# Patient Record
Sex: Female | Born: 2000 | Race: Black or African American | Hispanic: No | Smoking: Never smoker
Health system: Southern US, Community
[De-identification: ages and names within clinical notes are randomized; demographics above are authoritative.]

## PROBLEM LIST (undated history)

## (undated) DIAGNOSIS — L309 Dermatitis, unspecified: Secondary | ICD-10-CM

---
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
## 2021-07-08 HISTORY — PX: LIPOMA EXCISION: SHX5283

## 2022-05-20 ENCOUNTER — Other Ambulatory Visit (HOSPITAL_COMMUNITY)
Admission: RE | Admit: 2022-05-20 | Discharge: 2022-05-20 | Disposition: A | Discharge status: Home / Self Care | Payer: Medicaid Other | Source: Ambulatory Visit | Attending: Advanced Practice Midwife | Admitting: Advanced Practice Midwife

## 2022-05-20 ENCOUNTER — Ambulatory Visit (INDEPENDENT_AMBULATORY_CARE_PROVIDER_SITE_OTHER): Payer: Medicaid Other | Admitting: Obstetrics and Gynecology

## 2022-05-20 ENCOUNTER — Encounter: Payer: Self-pay | Admitting: Advanced Practice Midwife

## 2022-05-20 VITALS — BP 122/61 | HR 65 | Ht 69.0 in | Wt 203.4 lb

## 2022-05-20 DIAGNOSIS — Z309 Encounter for contraceptive management, unspecified: Secondary | ICD-10-CM

## 2022-05-20 DIAGNOSIS — Z3009 Encounter for other general counseling and advice on contraception: Secondary | ICD-10-CM | POA: Diagnosis not present

## 2022-05-20 DIAGNOSIS — Z30430 Encounter for insertion of intrauterine contraceptive device: Secondary | ICD-10-CM | POA: Diagnosis not present

## 2022-05-20 DIAGNOSIS — Z113 Encounter for screening for infections with a predominantly sexual mode of transmission: Secondary | ICD-10-CM | POA: Insufficient documentation

## 2022-05-20 LAB — POCT URINE PREGNANCY: Preg Test, Ur: NEGATIVE

## 2022-05-20 MED ORDER — LEVONORGESTREL 20 MCG/DAY IU IUD
1.0000 | INTRAUTERINE_SYSTEM | Freq: Once | INTRAUTERINE | Status: AC
  Administered 2022-05-20: 1 via INTRAUTERINE

## 2022-05-20 NOTE — Progress Notes (Signed)
20 yo P0 with LMP 04/18/22 with BMI 30 here for contraception counseling. Patient has been using COC for contraception and admits to being forgetful at time. She is interested in levonorgestrel- releasing IUD. Patient is without any complaints. She reports a monthly 5-7 day period which is on the heavier side. She is sexually active without complaints  Past Medical History:  Diagnosis Date   Eczema    Past Surgical History:  Procedure Laterality Date   LIPOMA EXCISION Right 2023   right buttock   History reviewed. No pertinent family history. Social History   Tobacco Use   Smoking status: Never   Smokeless tobacco: Never  Vaping Use   Vaping Use: Never used  Substance Use Topics   Alcohol use: Yes    Comment: social   Drug use: Yes    Types: Marijuana    Comment: edibles   ROS See pertinent in HPI. All other systems reviewed and non contributory Blood pressure 122/61, pulse 65, height 5\' 9"  (1.753 m), weight 203 lb 6.4 oz (92.3 kg), last menstrual period 04/18/2022. GENERAL: Well-developed, well-nourished female in no acute distress.  ABDOMEN: Soft, nontender, nondistended. No organomegaly. PELVIC: Normal external female genitalia. Vagina is pink and rugated.  Normal discharge. Normal appearing cervix. Uterus is normal in size. No adnexal mass or tenderness. Chaperone present during the pelvic exam EXTREMITIES: No cyanosis, clubbing, or edema, 2+ distal pulses.  A/P 21 yo P0 here for IUD insertion - GC/Cl cultures collected - IUD Procedure Note Patient identified, informed consent performed, signed copy in chart, time out was performed.  Urine pregnancy test negative.  Speculum placed in the vagina.  Cervix visualized.  Cleaned with Betadine x 3.  Grasped anteriorly with a single tooth tenaculum.  Uterus sounded to 8 cm.  Mirena IUD placed per manufacturer's recommendations.  Strings trimmed to 3 cm. Tenaculum was removed, good hemostasis noted.  Patient tolerated procedure  well.   Patient given post procedure instructions and Mirena care card with expiration date.  Patient is asked to check IUD strings periodically and follow up in 4-6 weeks for IUD check.

## 2022-05-20 NOTE — Progress Notes (Signed)
Patient presents as a new gyn. Patient wants to discuss switching to an IUD. States that she is not consistent with taking ocp. Denies having any vaginal discharge, odor, or irritation.

## 2022-05-20 NOTE — Addendum Note (Signed)
Addended by: Marya Landry D on: 05/20/2022 03:29 PM   Modules accepted: Orders

## 2022-05-21 LAB — URINE CYTOLOGY ANCILLARY ONLY
Chlamydia: NEGATIVE
Comment: NEGATIVE
Comment: NEGATIVE
Comment: NORMAL
Neisseria Gonorrhea: NEGATIVE
Trichomonas: NEGATIVE

## 2022-05-26 ENCOUNTER — Encounter: Payer: Self-pay | Admitting: Obstetrics and Gynecology

## 2022-06-17 ENCOUNTER — Encounter: Payer: Self-pay | Admitting: Obstetrics and Gynecology

## 2022-06-17 ENCOUNTER — Ambulatory Visit (INDEPENDENT_AMBULATORY_CARE_PROVIDER_SITE_OTHER): Payer: Medicaid Other | Admitting: Obstetrics and Gynecology

## 2022-06-17 VITALS — BP 124/79 | HR 60 | Wt 204.0 lb

## 2022-06-17 DIAGNOSIS — Z01812 Encounter for preprocedural laboratory examination: Secondary | ICD-10-CM | POA: Diagnosis not present

## 2022-06-17 DIAGNOSIS — Z30430 Encounter for insertion of intrauterine contraceptive device: Secondary | ICD-10-CM

## 2022-06-17 LAB — POCT URINE PREGNANCY: Preg Test, Ur: NEGATIVE

## 2022-06-17 MED ORDER — LEVONORGESTREL 20 MCG/DAY IU IUD
1.0000 | INTRAUTERINE_SYSTEM | Freq: Once | INTRAUTERINE | Status: AC
  Administered 2022-06-17: 1 via INTRAUTERINE

## 2022-06-17 NOTE — Progress Notes (Signed)
21 yo P0 here for IUD insertion. Patient had IUD inserted on 05/20/22 and IUD came out with tampon removal on 05/24/22. Patient is without any other complaints. She desires another Mirena IUD inserted  Past Medical History:  Diagnosis Date   Eczema    Past Surgical History:  Procedure Laterality Date   LIPOMA EXCISION Right 2023   right buttock   No family history on file. Social History   Tobacco Use   Smoking status: Never   Smokeless tobacco: Never  Vaping Use   Vaping Use: Never used  Substance Use Topics   Alcohol use: Yes    Comment: social   Drug use: Yes    Types: Marijuana    Comment: edibles   ROS See pertinent in HPI. All other systems reviewed and non contributory Blood pressure 124/79, pulse 60, weight 204 lb (92.5 kg), last menstrual period 05/24/2022. GENERAL: Well-developed, well-nourished female in no acute distress.  PELVIC: Normal external female genitalia. Vagina is pink and rugated.  Normal discharge. Normal appearing cervix. Uterus is normal in size. No adnexal mass or tenderness. Chaperone present during the pelvic exam EXTREMITIES: No cyanosis, clubbing, or edema, 2+ distal pulses.  A/P 21 yo here for IUD insertion IUD Procedure Note Patient identified, informed consent performed, signed copy in chart, time out was performed.  Urine pregnancy test negative.  Speculum placed in the vagina.  Cervix visualized.  Cleaned with Betadine x 2.  Grasped anteriorly with a single tooth tenaculum.  Uterus sounded to 8 cm.  Mirena IUD placed per manufacturer's recommendations.  Strings trimmed to 3 cm and tucked into the anterior fonix. Tenaculum was removed, good hemostasis noted.  Patient tolerated procedure well.   Patient given post procedure instructions and Mirena care card with expiration date.  Patient is asked to check IUD strings periodically and follow up in 4-6 weeks for IUD check.

## 2022-06-17 NOTE — Progress Notes (Signed)
Pt is in the office for [facility] follow up after IUD insertion on 05/20/22. Pt reports that IUD came out on her tampon on 05/24/22. Pt desires another IUD to be inserted.  LMP 05/24/22

## 2022-07-15 ENCOUNTER — Encounter: Payer: Self-pay | Admitting: Obstetrics and Gynecology

## 2022-07-15 ENCOUNTER — Other Ambulatory Visit (HOSPITAL_COMMUNITY)
Admission: RE | Admit: 2022-07-15 | Discharge: 2022-07-15 | Disposition: A | Discharge status: Home / Self Care | Payer: Medicaid Other | Source: Ambulatory Visit | Attending: Obstetrics and Gynecology | Admitting: Obstetrics and Gynecology

## 2022-07-15 ENCOUNTER — Ambulatory Visit (INDEPENDENT_AMBULATORY_CARE_PROVIDER_SITE_OTHER): Payer: Medicaid Other | Admitting: Obstetrics and Gynecology

## 2022-07-15 VITALS — BP 112/70 | HR 65 | Ht 69.0 in | Wt 209.0 lb

## 2022-07-15 DIAGNOSIS — Z124 Encounter for screening for malignant neoplasm of cervix: Secondary | ICD-10-CM

## 2022-07-15 DIAGNOSIS — Z30431 Encounter for routine checking of intrauterine contraceptive device: Secondary | ICD-10-CM | POA: Diagnosis not present

## 2022-07-15 NOTE — Progress Notes (Signed)
22 yo P0 presenting for IUD check. Patient had an IUD inserted on 06/17/22. She reports some occasional pelvic cramps and light vaginal bleeding. She has been sexually active without complaints or concerns. Patient is due for first pap smear  Past Medical History:  Diagnosis Date   Eczema    Past Surgical History:  Procedure Laterality Date   LIPOMA EXCISION Right 2023   right buttock   No family history on file. Social History   Tobacco Use   Smoking status: Never   Smokeless tobacco: Never  Vaping Use   Vaping Use: Never used  Substance Use Topics   Alcohol use: Yes    Comment: social   Drug use: Yes    Types: Marijuana    Comment: edibles   ROS See pertinent in HPI. All other systems reviewed and non contributory Blood pressure 112/70, pulse 65, height 5\' 9"  (1.753 m), weight 209 lb (94.8 kg), last menstrual period 06/23/2022.  GENERAL: Well-developed, well-nourished female in no acute distress.  ABDOMEN: Soft, nontender, nondistended. No organomegaly. PELVIC: Normal external female genitalia. Vagina is pink and rugated.  Normal discharge. Normal appearing cervix with IUD strings not visualized even following the use of a cytobrush. Uterus is normal in size. No adnexal mass or tenderness. Chaperone present during the pelvic exam EXTREMITIES: No cyanosis, clubbing, or edema, 2+ distal pulses.  A/P 22 yo here for IUD check - Pelvic ultrasound ordered to confirm intrauterine placement of IUD - Pap smear collected today - Patient will be contacted with abnormal results

## 2022-07-16 LAB — CYTOLOGY - PAP: Diagnosis: NEGATIVE

## 2022-07-24 ENCOUNTER — Ambulatory Visit
Admission: RE | Admit: 2022-07-24 | Discharge: 2022-07-24 | Disposition: A | Discharge status: Home / Self Care | Payer: Medicaid Other | Source: Ambulatory Visit | Attending: Obstetrics and Gynecology | Admitting: Obstetrics and Gynecology

## 2022-07-24 DIAGNOSIS — Z30431 Encounter for routine checking of intrauterine contraceptive device: Secondary | ICD-10-CM | POA: Diagnosis present

## 2022-07-25 NOTE — Addendum Note (Signed)
Addended by: Mora Bellman on: 07/25/2022 12:11 PM   Modules accepted: Orders

## 2022-07-25 NOTE — Progress Notes (Signed)
Advised pt that IUD was not visualized on [place] and of pelvic X-ray order. Advised condoms as back up contraception method until X-ray is completed and reviewed. Advised MD would review and we would contact pt for further instructions. Pt verbalized understanding.

## 2022-12-11 ENCOUNTER — Ambulatory Visit: Payer: Medicaid Other | Admitting: Obstetrics and Gynecology

## 2022-12-11 ENCOUNTER — Encounter: Payer: Self-pay | Admitting: Obstetrics and Gynecology

## 2022-12-11 VITALS — BP 129/82 | HR 70 | Ht 70.0 in | Wt 199.0 lb

## 2022-12-11 DIAGNOSIS — Z30017 Encounter for initial prescription of implantable subdermal contraceptive: Secondary | ICD-10-CM | POA: Diagnosis not present

## 2022-12-11 DIAGNOSIS — T8332XD Displacement of intrauterine contraceptive device, subsequent encounter: Secondary | ICD-10-CM | POA: Diagnosis not present

## 2022-12-11 LAB — POCT URINE PREGNANCY: Preg Test, Ur: NEGATIVE

## 2022-12-11 MED ORDER — ETONOGESTREL 68 MG ~~LOC~~ IMPL
68.0000 mg | DRUG_IMPLANT | Freq: Once | SUBCUTANEOUS | Status: AC
  Administered 2022-12-11: 68 mg via SUBCUTANEOUS

## 2022-12-11 NOTE — Addendum Note (Signed)
Addended by: Resa Miner S on: 12/11/2022 12:07 PM   Modules accepted: Orders

## 2022-12-11 NOTE — Progress Notes (Signed)
Pt wants to discuss birth control options. IUD not visualized on [place] in 07/2022. Has been using condoms as backup method since, but reports non-latex condoms break frequently with partner.

## 2022-12-11 NOTE — Progress Notes (Signed)
22 yo P0 here for contraception counseling. Patient had pelvic ultrasound demonstrating missing IUD. Patient has been using condoms for contraception and is interested in better contraception. She is without any other complaints  Past Medical History:  Diagnosis Date   Eczema    Past Surgical History:  Procedure Laterality Date   LIPOMA EXCISION Right 2023   right buttock   History reviewed. No pertinent family history. Social History   Tobacco Use   Smoking status: Never   Smokeless tobacco: Never  Vaping Use   Vaping Use: Never used  Substance Use Topics   Alcohol use: Yes    Comment: social   Drug use: Yes    Types: Marijuana    Comment: edibles   ROS  See pertinent in HPI. All other systems reviewed and non contributory  Blood pressure 129/82, pulse 70, height 5\' 10"  (1.778 m), weight 199 lb (90.3 kg), last menstrual period 11/28/2022. GENERAL: Well-developed, well-nourished female in no acute distress.  NEURO: alert and oriented x 3  A/P 22 yo here for contraception counseling - KUB ordered to ensure IUD is not in the pelvis - After counseling, patient opted for Nexplanon Patient given informed consent, signed copy in the chart, time out was performed. Pregnancy test was negative. Appropriate time out taken.  Patient's left arm was prepped and draped in the usual sterile fashion.. The ruler used to measure and mark insertion area.  Patient was prepped with alcohol swab and then injected with 3 cc of 1% lidocaine with epinephrine.  Patient was prepped with betadine, Nexplanon removed form packaging.  Device confirmed in needle, then inserted full length of needle and withdrawn per handbook instructions.  Patient insertion site covered with band aid and a coband.   Minimal blood loss.  Patient tolerated the procedure well.

## 2022-12-13 ENCOUNTER — Encounter: Payer: Self-pay | Admitting: Obstetrics and Gynecology

## 2023-02-23 ENCOUNTER — Encounter (HOSPITAL_BASED_OUTPATIENT_CLINIC_OR_DEPARTMENT_OTHER): Payer: Self-pay | Admitting: Radiology

## 2023-02-23 ENCOUNTER — Ambulatory Visit (HOSPITAL_BASED_OUTPATIENT_CLINIC_OR_DEPARTMENT_OTHER)
Admission: RE | Admit: 2023-02-23 | Discharge: 2023-02-23 | Disposition: A | Discharge status: Home / Self Care | Payer: Medicaid Other | Source: Ambulatory Visit | Attending: Obstetrics and Gynecology | Admitting: Obstetrics and Gynecology

## 2023-02-23 ENCOUNTER — Emergency Department (HOSPITAL_BASED_OUTPATIENT_CLINIC_OR_DEPARTMENT_OTHER)
Admission: EM | Admit: 2023-02-23 | Discharge status: Absent without leave | Payer: Medicaid Other | Source: Home / Self Care

## 2023-02-23 DIAGNOSIS — T8332XD Displacement of intrauterine contraceptive device, subsequent encounter: Secondary | ICD-10-CM | POA: Diagnosis present
